# Patient Record
Sex: Female | Born: 2000 | Race: White | Hispanic: No | Marital: Single | Smoking: Never smoker
Health system: Southern US, Community
[De-identification: ages and names within clinical notes are randomized; demographics above are authoritative.]

---
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
## 2021-04-29 ENCOUNTER — Other Ambulatory Visit (HOSPITAL_BASED_OUTPATIENT_CLINIC_OR_DEPARTMENT_OTHER): Payer: Self-pay | Admitting: Orthopaedic Surgery

## 2021-04-29 DIAGNOSIS — S86899A Other injury of other muscle(s) and tendon(s) at lower leg level, unspecified leg, initial encounter: Secondary | ICD-10-CM

## 2021-04-29 MED ORDER — MELOXICAM 15 MG PO TABS: 15.0000 mg | ORAL_TABLET | Freq: Every day | ORAL | 2 refills | Status: AC

## 2021-04-29 NOTE — Progress Notes (Signed)
  Name: Marleah Beever  History of Present Illness:   Bettejane Leavens is a 20 y.o. female with bilateral shin pain ongoing on multiple years.  This is worse with specific activities such as running and soccer.  It is worse with longer distances.  She has tried ibuprofen over-the-counter with minimal relief   Assessment and Plan:   Bilateral medial tibial stress syndrome.  We will plan for lateral heel posting with silicon wedges.  We will also prescribe Mobic for short period to see if this improves her pain  Comprehensive Musculoskeletal Exam:   Tenderness to palpation about the medial tibial bilaterally.  Worse on the right than the left.  Full normal range of motion about the foot and ankle.  Cavus foot on examination  Imaging:   None  Huel Cote, MD Attending Physician, Orthopedic Surgery  This document was dictated using Dragon voice recognition software. A reasonable attempt at proof reading has been made to minimize errors.

## 2021-08-23 ENCOUNTER — Other Ambulatory Visit: Payer: Self-pay

## 2021-08-23 ENCOUNTER — Ambulatory Visit (INDEPENDENT_AMBULATORY_CARE_PROVIDER_SITE_OTHER): Payer: BC Managed Care – PPO | Admitting: Orthopaedic Surgery

## 2021-08-23 DIAGNOSIS — S86899A Other injury of other muscle(s) and tendon(s) at lower leg level, unspecified leg, initial encounter: Secondary | ICD-10-CM | POA: Diagnosis not present

## 2021-08-23 NOTE — Progress Notes (Signed)
°  Name: Christie Floyd  History of Present Illness:   Aniqua Briere is a 21 y.o. female with bilateral shin pain ongoing on multiple years.  She has been attempting to rest during off season at [employer] [facility] [employer] soccer but with little relief.  She did finish her Mobic prescription but again continues to have medial sided tibial pain.  She has been running on the treadmill which has been flaring up her pain.  She is crosstraining at this point but longer distance running continues to aggravate her pain.  At this time she is trialed physical therapy as well as rest stretching.  None of this appears to be working at this time   Assessment and Plan:   Bilateral medial tibial stress syndrome.  At this time she has trialed multiple nonoperative management.  I do believe that bilateral MRIs are necessary at this time to rule out any type of stress reaction that would potentially require a period of nonweightbearing.  I have also plan to place a referral to Dr. Logan Bores for custom orthotics.  I would also like to order a vitamin D level today as it is wintertime and I would like to make sure that her bone metabolism is optimized at this time.  I believe that advance imaging in the form of an MRI is indicated for the following reasons: -Xrays images were obtained and not diagnostic -The patient has failed treatment modalities including rest, ice, stretching, therapy, activity modification, NSAIDs including Mobic -The following worrisome symptoms are present on history and exam: Focal tenderness over the medial tibia possible stress reaction    Comprehensive Musculoskeletal Exam:   Tenderness to palpation about the medial tibial bilaterally.  Worse on the right than the left.  Full normal range of motion about the foot and ankle.  Cavus foot on examination  Imaging:   None  Huel Cote, MD Attending Physician, Orthopedic Surgery  This document was dictated using Dragon voice recognition software. A reasonable  attempt at proof reading has been made to minimize errors.

## 2021-08-25 ENCOUNTER — Other Ambulatory Visit: Payer: Self-pay

## 2021-08-25 ENCOUNTER — Ambulatory Visit (INDEPENDENT_AMBULATORY_CARE_PROVIDER_SITE_OTHER): Payer: BC Managed Care – PPO

## 2021-08-25 DIAGNOSIS — S86891A Other injury of other muscle(s) and tendon(s) at lower leg level, right leg, initial encounter: Secondary | ICD-10-CM

## 2021-08-25 DIAGNOSIS — S86892A Other injury of other muscle(s) and tendon(s) at lower leg level, left leg, initial encounter: Secondary | ICD-10-CM

## 2021-08-25 DIAGNOSIS — S86899A Other injury of other muscle(s) and tendon(s) at lower leg level, unspecified leg, initial encounter: Secondary | ICD-10-CM

## 2021-08-25 IMAGING — MR MR [PERSON_NAME] LOW W/O CM*L*
8 series · 40 of 40 positions shown · non-contrast
Comparison: None.

CLINICAL DATA: Bilateral lower leg pain, stress fracture suspected

EXAM:
MRI OF LOWER LEFT EXTREMITY WITHOUT CONTRAST; MRI OF LOWER RIGHT
EXTREMITY WITHOUT CONTRAST
TECHNIQUE: Multiplanar, multisequence MR imaging of the bilateral lower legs
was performed. No intravenous contrast was administered.

[Series 8: T1 · coronal · 4.0mm · 0.59mm/px · 3 of 22 slices shown (1 of 3)]
[im 1/22]
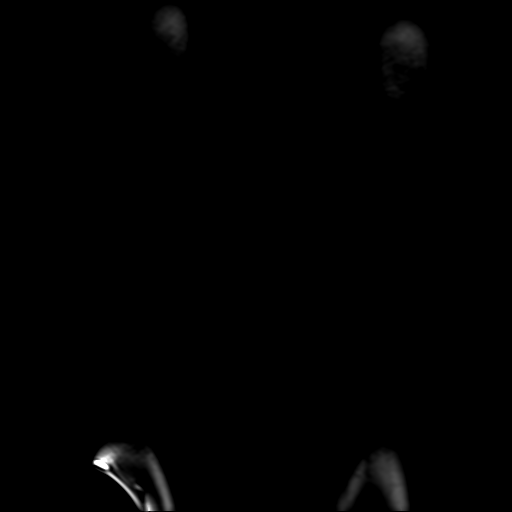
[im 11/22]
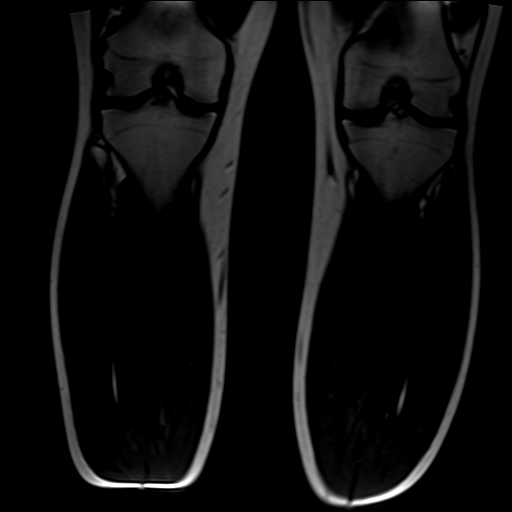
[im 22/22]
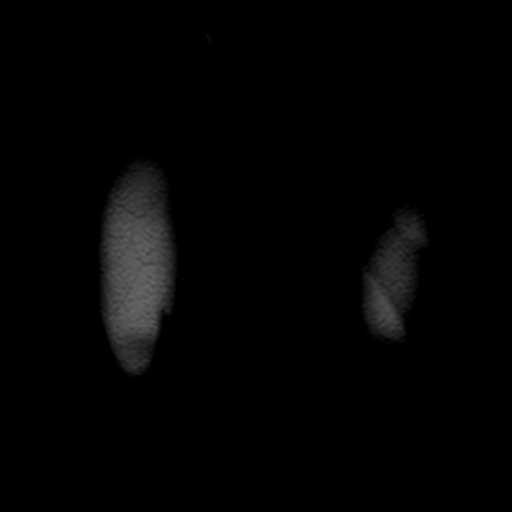

[Series 9: T1 · coronal · 4.0mm · 0.59mm/px · 3 of 25 slices shown (2 of 3)]
[im 1/25]
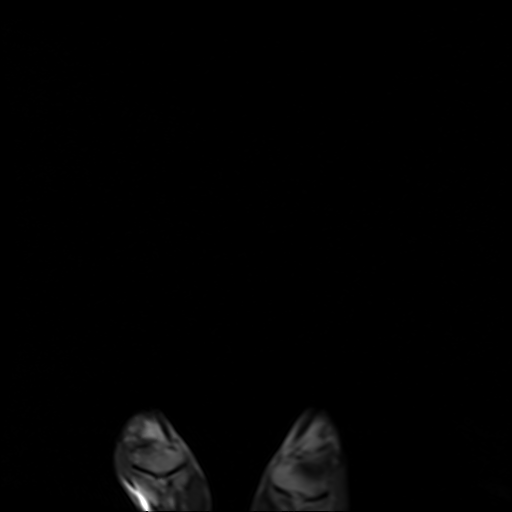
[im 13/25]
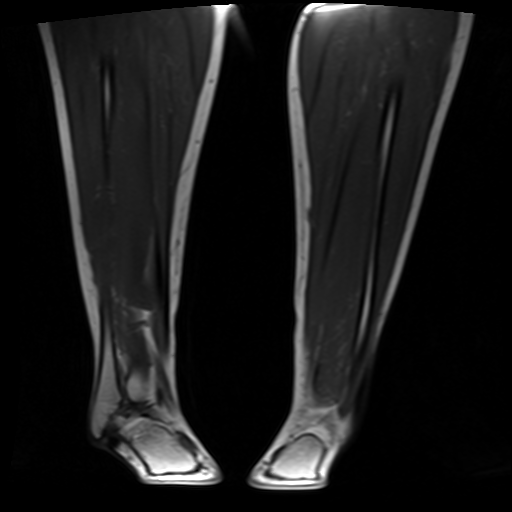
[im 25/25]
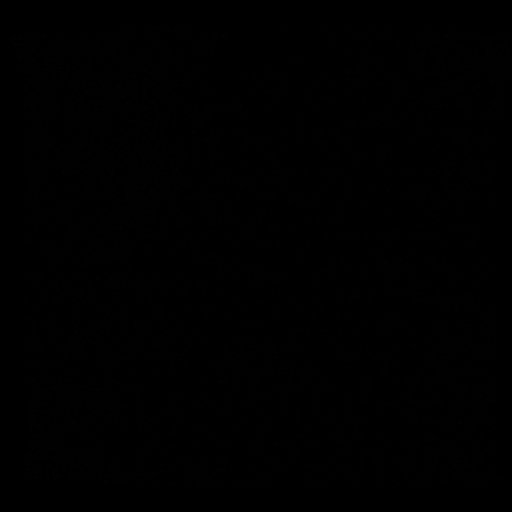

[Series 10: STIR · coronal · 4.0mm · 1.17mm/px · 3 of 22 slices shown (1 of 5)]
[im 1/22]
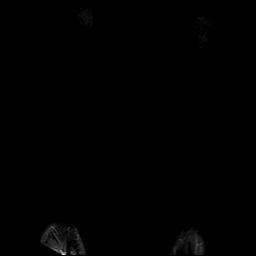
[im 11/22]
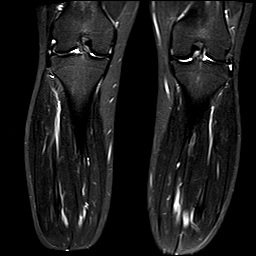
[im 22/22]
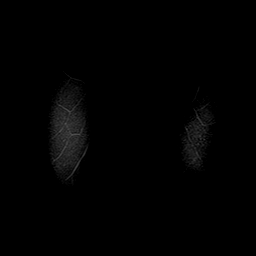

[Series 11: STIR · coronal · 4.0mm · 1.17mm/px · 3 of 25 slices shown (2 of 5)]
[im 1/25]
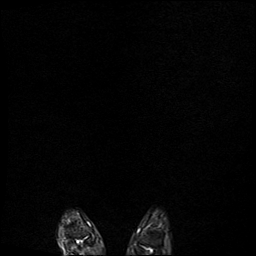
[im 13/25]
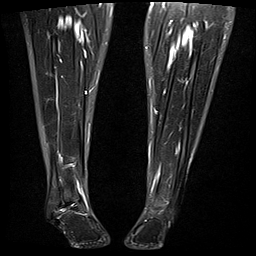
[im 25/25]
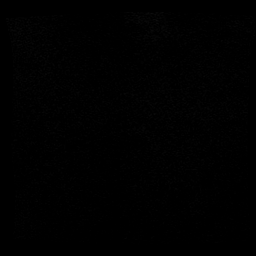

[Series 14: T1 · axial · 4.0mm · 0.78mm/px · z∈[-240,+136]mm · 11 of 85 slices shown (3 of 3)]
[im 1/85]
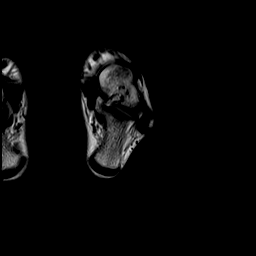
[im 9/85]
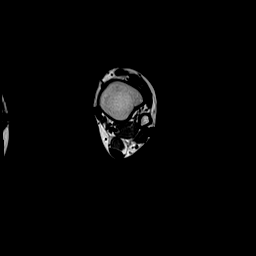
[im 17/85]
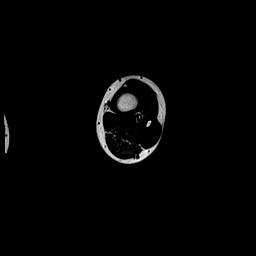
[im 26/85]
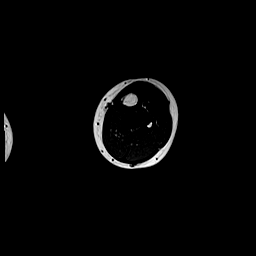
[im 34/85]
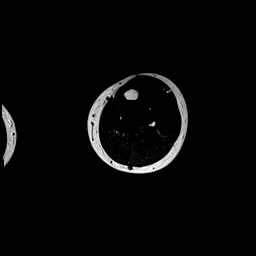
[im 43/85]
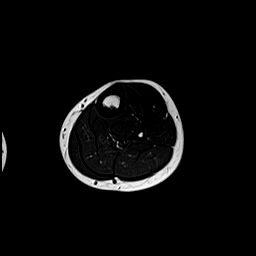
[im 51/85]
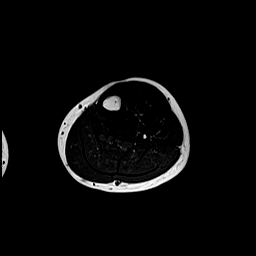
[im 59/85]
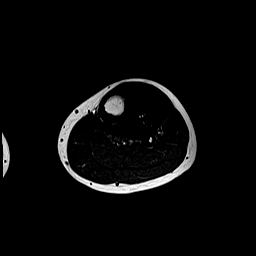
[im 68/85]
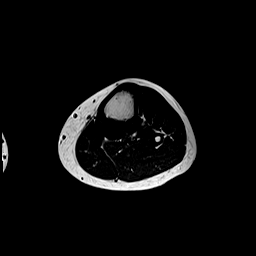
[im 76/85]
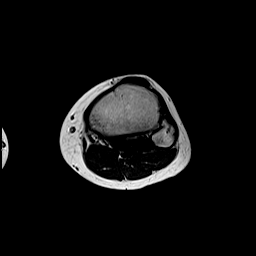
[im 85/85]
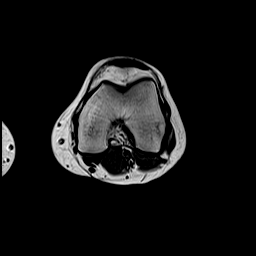

[Series 17: STIR · axial · 4.0mm · 0.78mm/px · z∈[-240,+136]mm · 11 of 85 slices shown (3 of 5)]
[im 1/85]
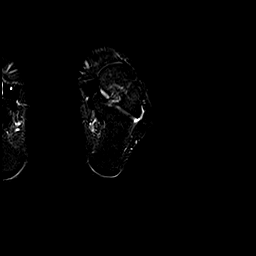
[im 9/85]
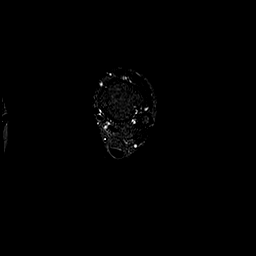
[im 17/85]
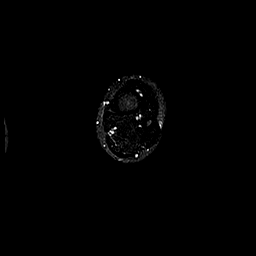
[im 26/85]
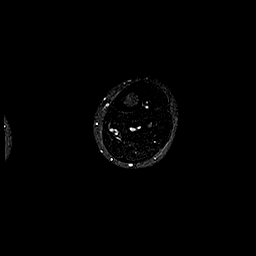
[im 34/85]
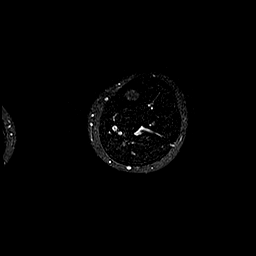
[im 43/85]
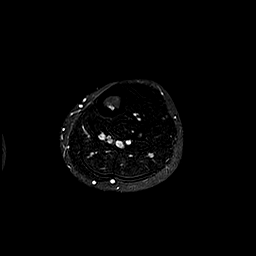
[im 51/85]
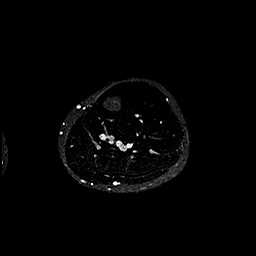
[im 59/85]
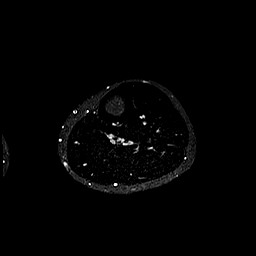
[im 68/85]
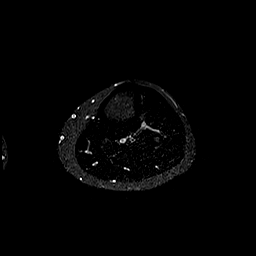
[im 76/85]
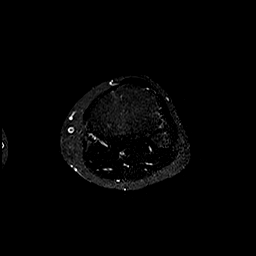
[im 85/85]
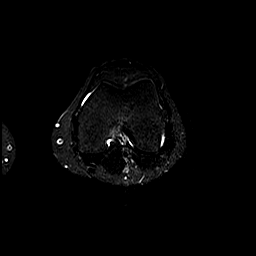

[Series 18: STIR · sagittal · 4.0mm · 1.17mm/px · 3 of 24 slices shown (4 of 5)]
[im 1/24]
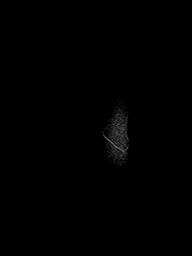
[im 12/24]
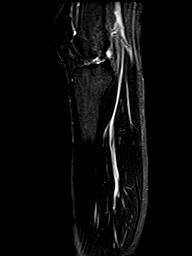
[im 24/24]
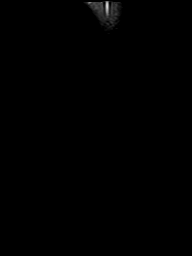

[Series 19: STIR · sagittal · 4.0mm · 0.94mm/px · 3 of 20 slices shown (5 of 5)]
[im 1/20]
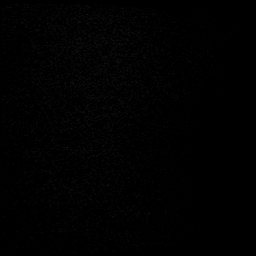
[im 10/20]
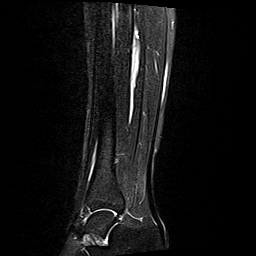
[im 20/20]
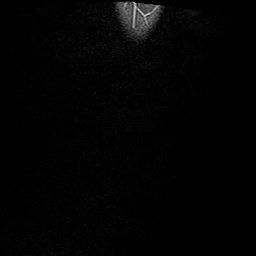

[40 of 40 positions shown; findings below may reference images not displayed]

FINDINGS: Bones/Joint/Cartilage

No acute fracture, bone marrow edema or suspicious bony lesions
identified in the bilateral lower legs.

Ligaments

Unremarkable.

Muscles and Tendons

Within normal limits bilaterally.

Soft tissues

Unremarkable.
IMPRESSION: Unremarkable MRI of the bilateral lower legs.

## 2021-08-25 IMAGING — MR MR [PERSON_NAME] LOW W/O CM*R*
8 series · 40 of 40 positions shown · non-contrast
Comparison: None.

CLINICAL DATA: Bilateral lower leg pain, stress fracture suspected

EXAM:
MRI OF LOWER LEFT EXTREMITY WITHOUT CONTRAST; MRI OF LOWER RIGHT
EXTREMITY WITHOUT CONTRAST
TECHNIQUE: Multiplanar, multisequence MR imaging of the bilateral lower legs
was performed. No intravenous contrast was administered.

[Series 2: T1 · coronal · 4.0mm · 0.59mm/px · 2 of 22 slices shown (1 of 3)]
[im 1/22]
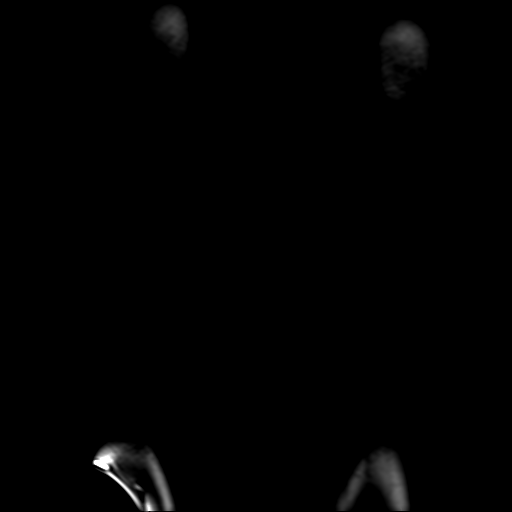
[im 22/22]
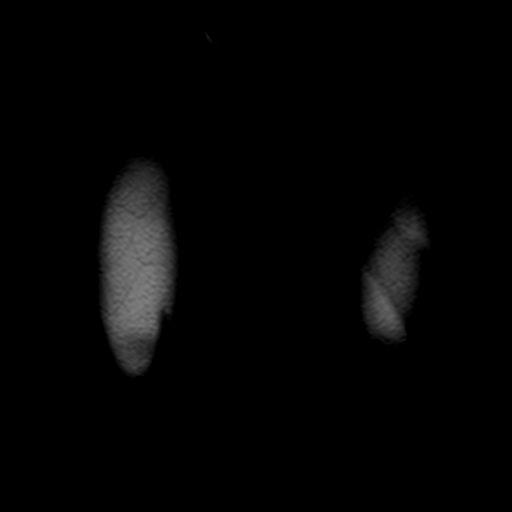

[Series 3: T1 · coronal · 4.0mm · 0.59mm/px · 3 of 25 slices shown (2 of 3)]
[im 1/25]
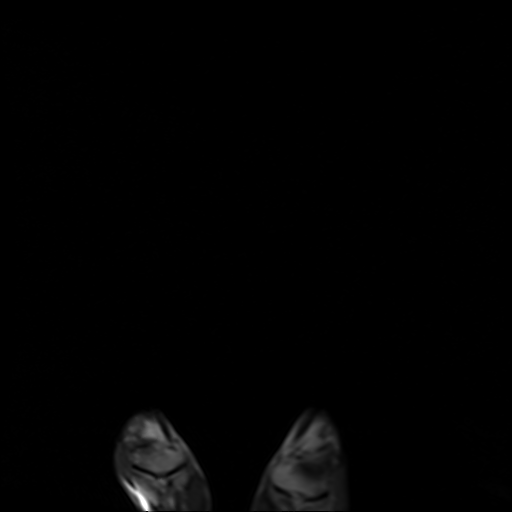
[im 13/25]
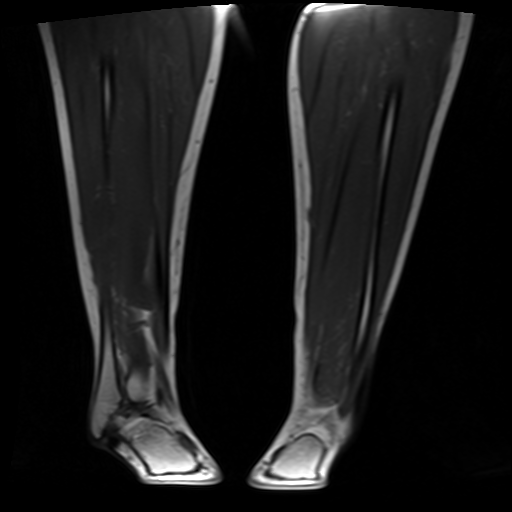
[im 25/25]
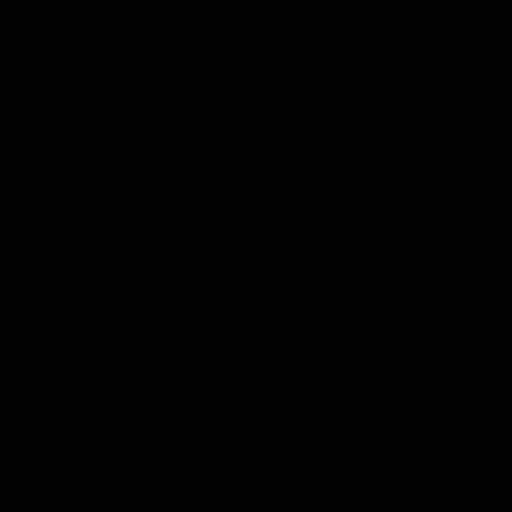

[Series 4: STIR · coronal · 4.0mm · 1.17mm/px · 3 of 22 slices shown (1 of 5)]
[im 1/22]
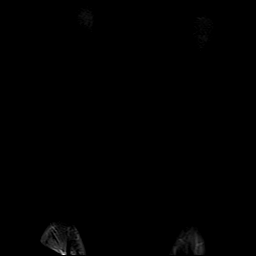
[im 11/22]
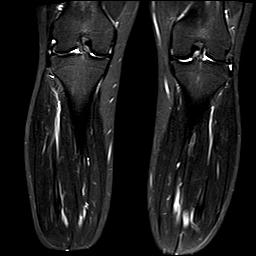
[im 22/22]
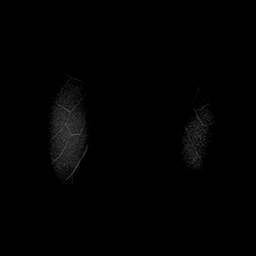

[Series 5: STIR · coronal · 4.0mm · 1.17mm/px · 3 of 25 slices shown (2 of 5)]
[im 1/25]
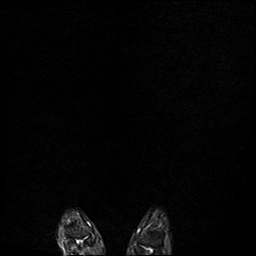
[im 13/25]
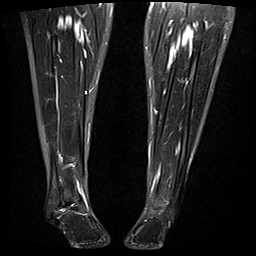
[im 25/25]
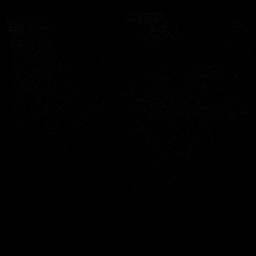

[Series 6: T1 · axial · 4.0mm · 0.78mm/px · z∈[-232,+144]mm · 12 of 91 slices shown (3 of 3)]
[im 1/91]
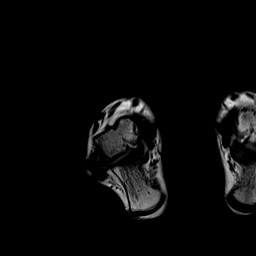
[im 9/91]
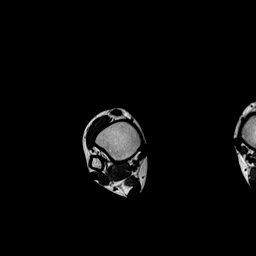
[im 17/91]
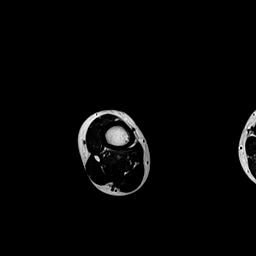
[im 25/91]
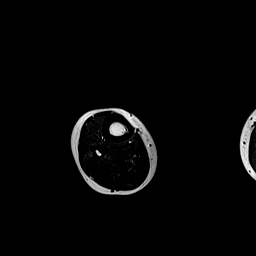
[im 33/91]
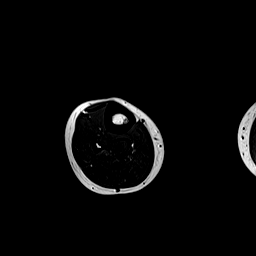
[im 41/91]
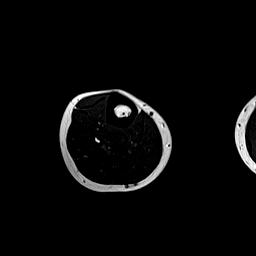
[im 50/91]
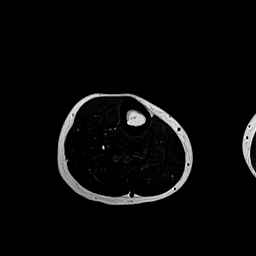
[im 58/91]
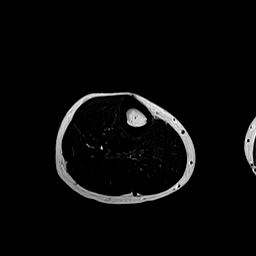
[im 66/91]
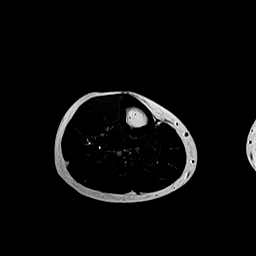
[im 74/91]
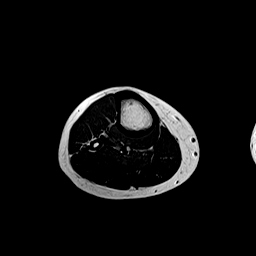
[im 82/91]
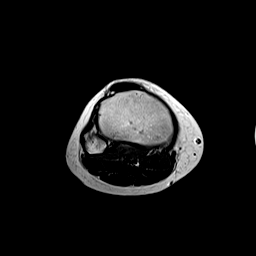
[im 91/91]
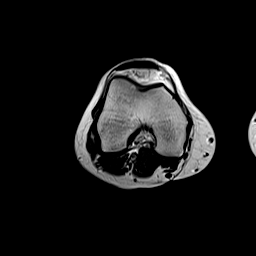

[Series 7: STIR · axial · 4.0mm · 0.78mm/px · z∈[-232,+144]mm · 12 of 91 slices shown (3 of 5)]
[im 1/91]
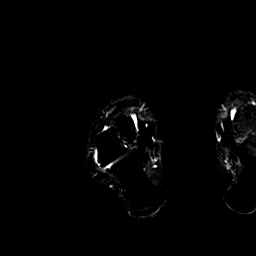
[im 9/91]
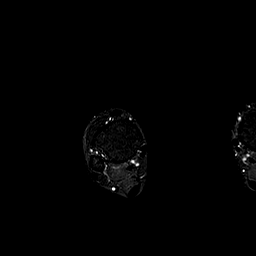
[im 17/91]
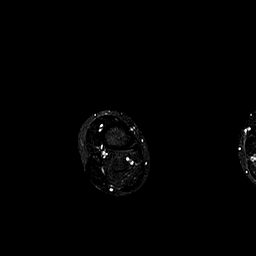
[im 25/91]
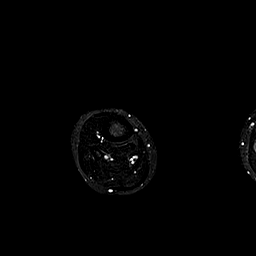
[im 33/91]
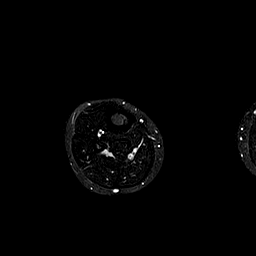
[im 41/91]
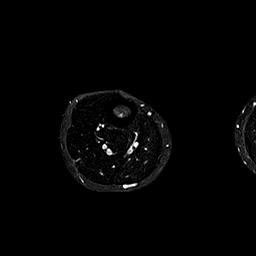
[im 50/91]
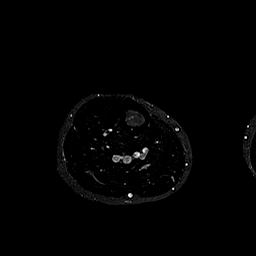
[im 58/91]
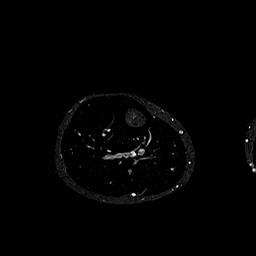
[im 66/91]
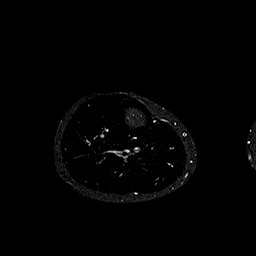
[im 74/91]
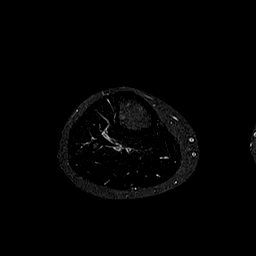
[im 82/91]
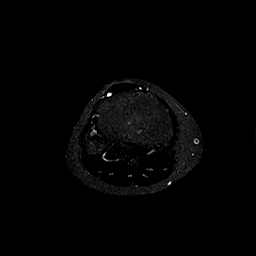
[im 91/91]
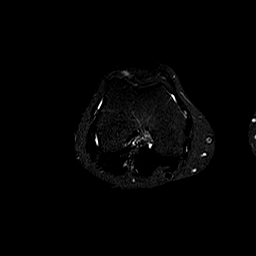

[Series 8: STIR · sagittal · 5.0mm · 0.94mm/px · 2 of 19 slices shown (4 of 5)]
[im 1/19]
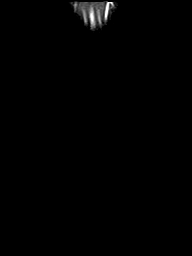
[im 19/19]
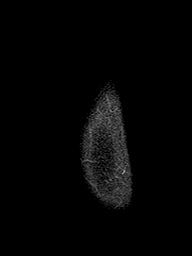

[Series 9: STIR · sagittal · 4.0mm · 0.94mm/px · 3 of 20 slices shown (5 of 5)]
[im 1/20]
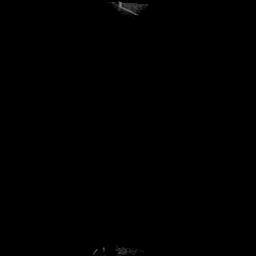
[im 10/20]
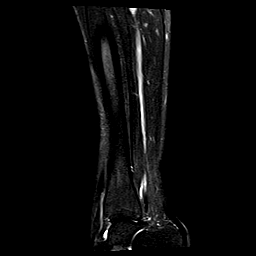
[im 20/20]
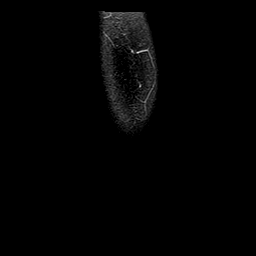

[40 of 40 positions shown; findings below may reference images not displayed]

FINDINGS: Bones/Joint/Cartilage

No acute fracture, bone marrow edema or suspicious bony lesions
identified in the bilateral lower legs.

Ligaments

Unremarkable.

Muscles and Tendons

Within normal limits bilaterally.

Soft tissues

Unremarkable.
IMPRESSION: Unremarkable MRI of the bilateral lower legs.

## 2021-08-26 ENCOUNTER — Encounter (HOSPITAL_BASED_OUTPATIENT_CLINIC_OR_DEPARTMENT_OTHER): Payer: Self-pay | Admitting: Orthopaedic Surgery

## 2021-08-26 NOTE — Progress Notes (Signed)
History of Present Illness:   Christie Floyd is a 21 y.o. female presents today for follow-up of her MRIs.  She does continue to have bilateral medial sided pain about the tibia.  She has been attempting to practice with little success.  I have asked her to run specifically in order to flareup this injury if that I can examine her directly following this.  Assessment and Plan:   Bilateral medial tibial stress syndrome.  MRI shows no specific tibial stress reaction.  At this time I do believe that the neck step would be a custom orthotic and to pursue a bone stimulator for the medial tibia bilaterally.  We will send a copy of her nutrition labs although her vitamin D is within normal limits.  I would like her to attempt to flareup this injury over the next weeks and I can reexamine her on Monday.  I would like to do this in an effort to rule out something like an exertional compartment syndrome.  Comprehensive Musculoskeletal Exam:   Tenderness to palpation about the medial tibial bilaterally.  Worse on the right than the left.  Full normal range of motion about the foot and ankle.  Cavus foot on examination  Imaging:   None  Huel Cote, MD Attending Physician, Orthopedic Surgery  This document was dictated using Dragon voice recognition software. A reasonable attempt at proof reading has been made to minimize errors.

## 2021-08-28 ENCOUNTER — Ambulatory Visit: Payer: BC Managed Care – PPO | Admitting: Podiatry

## 2021-09-02 ENCOUNTER — Encounter (HOSPITAL_BASED_OUTPATIENT_CLINIC_OR_DEPARTMENT_OTHER): Payer: Self-pay | Admitting: Orthopaedic Surgery

## 2021-09-02 NOTE — Progress Notes (Signed)
History of Present Illness:   Christie Floyd is a 21 y.o. female presents today for change in after having run flared up her injury.  She is here for reexamination and further discussion  Assessment and Plan:   Today Sanaiyah's Exam Was Significant and Consistent with right tibialis posterior tendinitis.  As result I would like to offer her a steroid injection with ultrasound guidance into the posterior tibial tendon  Comprehensive Musculoskeletal Exam:   Tenderness to palpation about the medial tibial bilaterally.  This is most prominent about the PT tendon.  She has pain with resisted plantar flexion.  Imaging:   None  Huel Cote, MD Attending Physician, Orthopedic Surgery  This document was dictated using Dragon voice recognition software. A reasonable attempt at proof reading has been made to minimize errors.

## 2021-09-04 ENCOUNTER — Other Ambulatory Visit: Payer: Self-pay

## 2021-09-04 ENCOUNTER — Ambulatory Visit: Payer: BC Managed Care – PPO

## 2021-09-04 ENCOUNTER — Ambulatory Visit (INDEPENDENT_AMBULATORY_CARE_PROVIDER_SITE_OTHER): Payer: BC Managed Care – PPO | Admitting: Podiatry

## 2021-09-04 ENCOUNTER — Ambulatory Visit (INDEPENDENT_AMBULATORY_CARE_PROVIDER_SITE_OTHER): Payer: BC Managed Care – PPO

## 2021-09-04 DIAGNOSIS — S9000XA Contusion of unspecified ankle, initial encounter: Secondary | ICD-10-CM | POA: Diagnosis not present

## 2021-09-04 DIAGNOSIS — M79672 Pain in left foot: Secondary | ICD-10-CM

## 2021-09-04 DIAGNOSIS — S9030XA Contusion of unspecified foot, initial encounter: Secondary | ICD-10-CM | POA: Diagnosis not present

## 2021-09-04 DIAGNOSIS — S86892A Other injury of other muscle(s) and tendon(s) at lower leg level, left leg, initial encounter: Secondary | ICD-10-CM

## 2021-09-04 DIAGNOSIS — M79671 Pain in right foot: Secondary | ICD-10-CM

## 2021-09-04 DIAGNOSIS — S86891A Other injury of other muscle(s) and tendon(s) at lower leg level, right leg, initial encounter: Secondary | ICD-10-CM | POA: Diagnosis not present

## 2021-09-04 NOTE — Progress Notes (Signed)
° °  HPI: 21 y.o. female active and very healthy presenting today as a new patient referral for evaluation of bilateral anterior leg pain.  Patient is a Solicitor at [employer] and has been dealing with was previously been diagnosed with shinsplints intermittently since her junior year in high school. Currently she has been treated and managed by Dr. Steward Drone who referred her here for orthotics. She states that in past medical visits they have ruled out stress reaction fractures, compartment syndrome, and have also suspected tendinitis of the bilateral legs.  She presents for further treatment and evaluation  No past medical history on file.  No past surgical history on file.  No Known Allergies   Physical Exam: General: The patient is alert and oriented x3 in no acute distress.  Dermatology: Skin is warm, dry and supple bilateral lower extremities. Negative for open lesions or macerations.  Vascular: Vascular status intact.  Palpable pedal pulses bilaterally. Capillary refill within normal limits.  Negative for any significant edema or erythema  Neurological: Light touch and protective threshold grossly intact  Musculoskeletal Exam: Pain on palpation to the medial border of the tibial diaphysis consistent with medial tibial stress syndrome.  There is limited dorsiflexion of the ankles bilateral. Bilateral lower extremities get very tight past 90 degrees of dorsiflexion, left worse than the right.  Silfverskiold test positive.  Radiographic Exam:  Normal osseous mineralization. Joint spaces preserved. No fracture/dislocation/boney destruction.  There does not appear to be any ankle impingement on lateral view  Assessment: 1.  Medial tibial stress syndrome (shinsplints) bilateral; chronic  Plan of Care:  1. Patient evaluated. X-Rays reviewed.  2.  Explained to the patient that I do believe that she is simply dealing with chronic shinsplints.  She did state that her orthopedist  considered injection to these areas which may help alleviate some of her pain and inflammation to the area. 3.  Stressed the importance of daily stretching exercises, specifically calf stretches 4.  Appointment with Pedorthist for custom molded orthotics 5.  Recommend oral anti-inflammatory (NSAIDs) as needed 6.  Return to clinic as needed    Felecia Shelling, DPM [facility]  Dr. Felecia Shelling, DPM    [address] [place], [place] [address]                Office [phone]  Fax [phone]

## 2021-09-06 ENCOUNTER — Other Ambulatory Visit: Payer: Self-pay

## 2021-09-06 ENCOUNTER — Ambulatory Visit (HOSPITAL_BASED_OUTPATIENT_CLINIC_OR_DEPARTMENT_OTHER): Payer: BC Managed Care – PPO | Admitting: Orthopaedic Surgery

## 2021-09-06 ENCOUNTER — Ambulatory Visit (INDEPENDENT_AMBULATORY_CARE_PROVIDER_SITE_OTHER): Payer: BC Managed Care – PPO | Admitting: Orthopaedic Surgery

## 2021-09-06 DIAGNOSIS — S86891A Other injury of other muscle(s) and tendon(s) at lower leg level, right leg, initial encounter: Secondary | ICD-10-CM

## 2021-09-06 DIAGNOSIS — S86899A Other injury of other muscle(s) and tendon(s) at lower leg level, unspecified leg, initial encounter: Secondary | ICD-10-CM

## 2021-09-06 DIAGNOSIS — M25571 Pain in right ankle and joints of right foot: Secondary | ICD-10-CM | POA: Diagnosis not present

## 2021-09-06 DIAGNOSIS — M79605 Pain in left leg: Secondary | ICD-10-CM | POA: Diagnosis not present

## 2021-09-06 MED ORDER — LIDOCAINE HCL 1 % IJ SOLN
4.0000 mL | INTRAMUSCULAR | Status: AC | PRN
  Administered 2021-09-06: 4 mL

## 2021-09-06 MED ORDER — TRIAMCINOLONE ACETONIDE 40 MG/ML IJ SUSP
80.0000 mg | INTRAMUSCULAR | Status: AC | PRN
  Administered 2021-09-06: 80 mg via INTRA_ARTICULAR

## 2021-09-06 NOTE — Progress Notes (Signed)
History of Present Illness:   Christie Floyd is a 21 y.o. female presents today for follow-up of her bilateral medial tibial stress syndrome versus posterior tibial tendinitis.  I have offered and recommended ultrasound-guided PT injections that we can further differentiate these diagnoses.  She is here today for right-sided injection as the side is bothering her more  Assessment and Plan:   Presumptive diagnosis of PT tendinitis.  At today's visit ultrasound-guided right PT tendon injection was performed today.  I would like her to go to practice that we can ultimately assess the efficacy of this injection. Comprehensive Musculoskeletal Exam:   Tenderness to palpation about the medial tibial bilaterally.  This is most prominent about the PT tendon.  She has pain with resisted plantar flexion.  Imaging:   None     Procedure Note  Patient: Christie Floyd             Date of Birth: 2001/04/03           MRN: [id number]             Visit Date: 09/06/2021  Procedures: Visit Diagnoses: No diagnosis found.  Large Joint Inj on 09/06/2021 1:52 PM Indications: pain Details: 22 G 1.5 in needle, ultrasound-guided anterior approach  Arthrogram: No  Medications: 4 mL lidocaine 1 %; 80 mg triamcinolone acetonide 40 MG/ML Outcome: tolerated well, no immediate complications  Right pt tendon, mid tibia/knee Procedure, treatment alternatives, risks and benefits explained, specific risks discussed. Consent was given by the patient. Immediately prior to procedure a time out was called to verify the correct patient, procedure, equipment, support staff and site/side marked as required. Patient was prepped and draped in the usual sterile fashion.      Huel Cote, MD Attending Physician, Orthopedic Surgery  This document was dictated using Dragon voice recognition software. A reasonable attempt at proof reading has been made to minimize errors.

## 2021-09-12 ENCOUNTER — Other Ambulatory Visit: Payer: Self-pay

## 2021-09-12 ENCOUNTER — Ambulatory Visit (INDEPENDENT_AMBULATORY_CARE_PROVIDER_SITE_OTHER): Payer: BC Managed Care – PPO

## 2021-09-12 DIAGNOSIS — S86892A Other injury of other muscle(s) and tendon(s) at lower leg level, left leg, initial encounter: Secondary | ICD-10-CM

## 2021-09-12 DIAGNOSIS — S9000XA Contusion of unspecified ankle, initial encounter: Secondary | ICD-10-CM

## 2021-09-12 DIAGNOSIS — S9030XA Contusion of unspecified foot, initial encounter: Secondary | ICD-10-CM

## 2021-09-12 DIAGNOSIS — S86891A Other injury of other muscle(s) and tendon(s) at lower leg level, right leg, initial encounter: Secondary | ICD-10-CM | POA: Diagnosis not present

## 2021-09-12 NOTE — Progress Notes (Signed)
SITUATION Reason for Consult: Evaluation for Bilateral Custom Foot Orthoses Patient / Caregiver Report: Patient is ready for foot orthotics  OBJECTIVE DATA: Patient History / Diagnosis:    ICD-10-CM   1. Medial tibial stress syndrome, right, initial encounter  [id number]     2. Medial tibial stress syndrome, left, initial encounter  [id number]     3. Contusion of ankle, unspecified laterality, initial encounter  S90.00XA     4. Contusion of foot, unspecified laterality, initial encounter  S90.30XA       Current or Previous Devices:  None and no history   Foot Examination: Skin presentation:   Intact Ulcers & Callousing:   None and no history Toe / Foot Deformities:  None Weight Bearing Presentation:  Rectus Sensation:    Intact  ORTHOTIC RECOMMENDATION Recommended Device: 1x pair of custom functional foot orthotics  GOALS OF ORTHOSES - Reduce Pain - Prevent Foot Deformity - Prevent Progression of Further Foot Deformity - Relieve Pressure - Improve the Overall Biomechanical Function of the Foot and Lower Extremity.  ACTIONS PERFORMED Patient was casted for Foot Orthoses via crush box. Procedure was explained and patient tolerated procedure well. All questions were answered and concerns addressed.  PLAN Potential out of pocket cost was communicated to patient. Casts are to be sent to [facility] for fabrication. Patient is to be called for fitting when devices are ready.

## 2021-09-16 ENCOUNTER — Encounter (HOSPITAL_BASED_OUTPATIENT_CLINIC_OR_DEPARTMENT_OTHER): Payer: Self-pay | Admitting: Orthopaedic Surgery

## 2021-09-16 NOTE — Progress Notes (Signed)
History of Present Illness:   Christie Floyd is a 21 y.o. female presents today for follow-up of her bilateral medial tibial stress syndrome versus posterior tibial tendinitis.  Most recently had an injection into the right PT musculotendinous junction and is feeling much better. She would like a left sided injection given that she is feeling much better.  Assessment and Plan:   Presumptive diagnosis of PT tendinitis bilaterally. Will plan for a left sided office injection this week.   Comprehensive Musculoskeletal Exam:   Tenderness to palpation about the medial tibial bilaterally.  This is most prominent about the PT tendon.  She has pain with resisted plantar flexion.  Imaging:   None      Christie Mulders, MD Attending Physician, Orthopedic Surgery  This document was dictated using Dragon voice recognition software. A reasonable attempt at proof reading has been made to minimize errors.

## 2021-09-19 ENCOUNTER — Other Ambulatory Visit: Payer: Self-pay

## 2021-09-19 ENCOUNTER — Ambulatory Visit (INDEPENDENT_AMBULATORY_CARE_PROVIDER_SITE_OTHER): Payer: BC Managed Care – PPO | Admitting: Orthopaedic Surgery

## 2021-09-19 DIAGNOSIS — S86899A Other injury of other muscle(s) and tendon(s) at lower leg level, unspecified leg, initial encounter: Secondary | ICD-10-CM

## 2021-09-19 NOTE — Progress Notes (Signed)
History of Present Illness:   Christie Floyd is a 21 y.o. female presents for follow-up of her known PT tendinitis.  She is here today for a left leg PT musculotendinous injection.  She states that the right knee is feeling very good.  She does not have any pain although she is not significantly working on it.  She has been out of soccer so is having minimal pain.  Assessment and Plan:   Left PT tendon musculotendinous ultrasound-guided injection performed today after verbal consent obtained.  I would like her to refrain from significant activity for total of 1 week.  I will see her back in training room  Comprehensive Musculoskeletal Exam:   Tenderness to palpation about the medial tibial bilaterally.  This is occurring at the junction of the musculotendinous PT.  This is most prominent about the PT tendon.  She has pain with resisted plantar flexion.  Imaging:   None      Huel Cote, MD Attending Physician, Orthopedic Surgery  This document was dictated using Dragon voice recognition software. A reasonable attempt at proof reading has been made to minimize errors.

## 2021-09-23 ENCOUNTER — Encounter (HOSPITAL_BASED_OUTPATIENT_CLINIC_OR_DEPARTMENT_OTHER): Payer: Self-pay | Admitting: Orthopaedic Surgery

## 2021-09-23 NOTE — Progress Notes (Signed)
History of Present Illness:   Christie Floyd is a 21 y.o. female presents for follow-up of her known PT tendinitis.  She has now had both sides injected at the musculotendinous junction. Most recently she had her left side injection which feels much better today.   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 At this time, I would like her to participate for 50% of practice starting this Friday without any endurance/impact running for 1 additional week to allow her injections to achieve maximal efficacy. I will her back in 1 week and reassess.  Comprehensive Musculoskeletal Exam:   Tenderness to palpation about the medial tibial bilaterally.  This is occurring at the junction of the musculotendinous PT.  This is most prominent about the PT tendon.  She has pain with resisted plantar flexion.  Imaging:   None      Huel Cote, MD Attending Physician, Orthopedic Surgery  This document was dictated using Dragon voice recognition software. A reasonable attempt at proof reading has been made to minimize errors.

## 2021-10-04 ENCOUNTER — Ambulatory Visit (HOSPITAL_BASED_OUTPATIENT_CLINIC_OR_DEPARTMENT_OTHER): Payer: No Typology Code available for payment source | Admitting: Orthopaedic Surgery

## 2021-10-14 ENCOUNTER — Other Ambulatory Visit (HOSPITAL_BASED_OUTPATIENT_CLINIC_OR_DEPARTMENT_OTHER): Payer: Self-pay | Admitting: Orthopaedic Surgery

## 2021-10-14 DIAGNOSIS — S86899A Other injury of other muscle(s) and tendon(s) at lower leg level, unspecified leg, initial encounter: Secondary | ICD-10-CM

## 2021-10-15 ENCOUNTER — Other Ambulatory Visit (HOSPITAL_BASED_OUTPATIENT_CLINIC_OR_DEPARTMENT_OTHER): Payer: Self-pay | Admitting: Orthopaedic Surgery

## 2021-10-15 DIAGNOSIS — S86899A Other injury of other muscle(s) and tendon(s) at lower leg level, unspecified leg, initial encounter: Secondary | ICD-10-CM

## 2021-10-22 ENCOUNTER — Ambulatory Visit: Payer: BC Managed Care – PPO

## 2021-10-22 ENCOUNTER — Other Ambulatory Visit: Payer: Self-pay

## 2021-10-22 DIAGNOSIS — S86891A Other injury of other muscle(s) and tendon(s) at lower leg level, right leg, initial encounter: Secondary | ICD-10-CM

## 2021-10-22 DIAGNOSIS — S86892A Other injury of other muscle(s) and tendon(s) at lower leg level, left leg, initial encounter: Secondary | ICD-10-CM

## 2021-10-22 DIAGNOSIS — S9030XA Contusion of unspecified foot, initial encounter: Secondary | ICD-10-CM

## 2021-10-22 NOTE — Progress Notes (Signed)
SITUATION: ?Reason for Visit: Fitting and Delivery of Custom Fabricated Foot Orthoses ?Patient Report: Patient reports comfort and is satisfied with device. ? ?OBJECTIVE DATA: ?Patient History / Diagnosis:   ?  ICD-10-CM   ?1. Contusion of foot, unspecified laterality, initial encounter  S90.30XA   ?  ?2. Medial tibial stress syndrome, right, initial encounter  U31.497W   ?  ?3. Medial tibial stress syndrome, left, initial encounter  S86.892A   ?  ? ? ?Provided Device:  Custom Functional Foot Orthotics ?    [facility]: [id number] ? ?GOAL OF ORTHOSIS ?- Improve gait ?- Decrease energy expenditure ?- Improve Balance ?- Provide Triplanar stability of foot complex ?- Facilitate motion ? ?ACTIONS PERFORMED ?Patient was fit with foot orthotics trimmed to shoe last. Patient tolerated fittign procedure.  ? ?Patient was provided with verbal and written instruction and demonstration regarding donning, doffing, wear, care, proper fit, function, purpose, cleaning, and use of the orthosis and in all related precautions and risks and benefits regarding the orthosis. ? ?Patient was also provided with verbal instruction regarding how to report any failures or malfunctions of the orthosis and necessary follow up care. Patient was also instructed to contact our office regarding any change in status that may affect the function of the orthosis. ? ?Patient demonstrated independence with proper donning, doffing, and fit and verbalized understanding of all instructions. ? ?PLAN: ?Patient is to follow up in one week or as necessary (PRN). All questions were answered and concerns addressed. Plan of care was discussed with and agreed upon by the patient. ? ?

## 2021-10-24 ENCOUNTER — Ambulatory Visit (HOSPITAL_BASED_OUTPATIENT_CLINIC_OR_DEPARTMENT_OTHER): Payer: Self-pay | Admitting: Physical Therapy

## 2022-03-03 ENCOUNTER — Other Ambulatory Visit: Payer: BC Managed Care – PPO

## 2022-03-31 ENCOUNTER — Telehealth: Payer: Self-pay | Admitting: Podiatry

## 2022-03-31 NOTE — Telephone Encounter (Signed)
Lvm for patient to contact office to schedule appointment for OPU

## 2022-04-02 ENCOUNTER — Ambulatory Visit (INDEPENDENT_AMBULATORY_CARE_PROVIDER_SITE_OTHER): Payer: BC Managed Care – PPO

## 2022-04-02 DIAGNOSIS — S9030XA Contusion of unspecified foot, initial encounter: Secondary | ICD-10-CM

## 2022-04-02 NOTE — Progress Notes (Signed)
Patient presents today to pick up custom molded foot orthotics, diagnosed with contusion of the foot by Dr. Logan Bores.   Orthotics were dispensed and fit was satisfactory. Reviewed instructions for break-in and wear. Written instructions given to patient.  Patient will follow up as needed.   [name] - order # [id number]

## 2022-04-03 ENCOUNTER — Other Ambulatory Visit: Payer: BC Managed Care – PPO
